# Patient Record
Sex: Female | Born: 1980 | Race: Black or African American | Hispanic: No
Health system: Southern US, Community
[De-identification: ages and names within clinical notes are randomized; demographics above are authoritative.]

---

## 2015-11-07 ENCOUNTER — Inpatient Hospital Stay
Admission: AD | Admit: 2015-11-07 | Discharge: 2015-12-04 | Disposition: A | Discharge status: Skilled Nursing Facility | Payer: Self-pay | Source: Ambulatory Visit | Attending: Internal Medicine | Admitting: Internal Medicine

## 2015-11-07 ENCOUNTER — Institutional Professional Consult (permissible substitution) (HOSPITAL_COMMUNITY): Payer: Self-pay

## 2015-11-07 DIAGNOSIS — I871 Compression of vein: Secondary | ICD-10-CM

## 2015-11-07 DIAGNOSIS — I509 Heart failure, unspecified: Secondary | ICD-10-CM

## 2015-11-07 DIAGNOSIS — A499 Bacterial infection, unspecified: Secondary | ICD-10-CM

## 2015-11-07 DIAGNOSIS — S069XAA Unspecified intracranial injury with loss of consciousness status unknown, initial encounter: Secondary | ICD-10-CM

## 2015-11-07 DIAGNOSIS — Z95828 Presence of other vascular implants and grafts: Secondary | ICD-10-CM

## 2015-11-07 DIAGNOSIS — J9690 Respiratory failure, unspecified, unspecified whether with hypoxia or hypercapnia: Secondary | ICD-10-CM

## 2015-11-07 DIAGNOSIS — J9620 Acute and chronic respiratory failure, unspecified whether with hypoxia or hypercapnia: Secondary | ICD-10-CM

## 2015-11-07 DIAGNOSIS — Z1612 Extended spectrum beta lactamase (ESBL) resistance: Secondary | ICD-10-CM

## 2015-11-07 DIAGNOSIS — S069X9A Unspecified intracranial injury with loss of consciousness of unspecified duration, initial encounter: Secondary | ICD-10-CM

## 2015-11-07 DIAGNOSIS — J690 Pneumonitis due to inhalation of food and vomit: Secondary | ICD-10-CM

## 2015-11-07 DIAGNOSIS — Z89619 Acquired absence of unspecified leg above knee: Secondary | ICD-10-CM

## 2015-11-07 DIAGNOSIS — Z931 Gastrostomy status: Secondary | ICD-10-CM

## 2015-11-07 DIAGNOSIS — R627 Adult failure to thrive: Secondary | ICD-10-CM

## 2015-11-07 MED ORDER — DIATRIZOATE MEGLUMINE & SODIUM 66-10 % PO SOLN
ORAL | Status: AC
  Administered 2015-11-07: 30 mL via GASTROSTOMY
  Filled 2015-11-07: qty 30

## 2015-11-08 DIAGNOSIS — R627 Adult failure to thrive: Secondary | ICD-10-CM

## 2015-11-08 DIAGNOSIS — J690 Pneumonitis due to inhalation of food and vomit: Secondary | ICD-10-CM

## 2015-11-08 DIAGNOSIS — Z89619 Acquired absence of unspecified leg above knee: Secondary | ICD-10-CM

## 2015-11-08 DIAGNOSIS — A499 Bacterial infection, unspecified: Secondary | ICD-10-CM

## 2015-11-08 DIAGNOSIS — S069XAA Unspecified intracranial injury with loss of consciousness status unknown, initial encounter: Secondary | ICD-10-CM

## 2015-11-08 DIAGNOSIS — S069X9A Unspecified intracranial injury with loss of consciousness of unspecified duration, initial encounter: Secondary | ICD-10-CM

## 2015-11-08 DIAGNOSIS — Z1612 Extended spectrum beta lactamase (ESBL) resistance: Secondary | ICD-10-CM

## 2015-11-08 DIAGNOSIS — J9620 Acute and chronic respiratory failure, unspecified whether with hypoxia or hypercapnia: Secondary | ICD-10-CM

## 2015-11-08 DIAGNOSIS — I509 Heart failure, unspecified: Secondary | ICD-10-CM

## 2015-11-08 LAB — COMPREHENSIVE METABOLIC PANEL
ALK PHOS: 143 U/L — AB (ref 38–126)
ALT: 39 U/L (ref 14–54)
AST: 39 U/L (ref 15–41)
Albumin: 2.9 g/dL — ABNORMAL LOW (ref 3.5–5.0)
Anion gap: 9 (ref 5–15)
BUN: 21 mg/dL — ABNORMAL HIGH (ref 6–20)
CALCIUM: 9.9 mg/dL (ref 8.9–10.3)
CO2: 29 mmol/L (ref 22–32)
CREATININE: 0.61 mg/dL (ref 0.44–1.00)
Chloride: 106 mmol/L (ref 101–111)
GFR calc non Af Amer: >60 mL/min (ref >60)
GLUCOSE: 176 mg/dL — AB (ref 65–99)
Potassium: 3.8 mmol/L (ref 3.5–5.1)
Sodium: 144 mmol/L (ref 135–145)
Total Bilirubin: 0.7 mg/dL (ref 0.3–1.2)
Total Protein: 8.5 g/dL — ABNORMAL HIGH (ref 6.5–8.1)

## 2015-11-08 LAB — PROTIME-INR
INR: 1.17 (ref 0.00–1.49)
Prothrombin Time: 15.1 seconds (ref 11.6–15.2)

## 2015-11-08 LAB — BLOOD GAS, ARTERIAL
Acid-Base Excess: 4.4 mmol/L — ABNORMAL HIGH (ref 0.0–2.0)
BICARBONATE: 28.3 meq/L — AB (ref 20.0–24.0)
FIO2: 0.4
O2 SAT: 97.1 %
PATIENT TEMPERATURE: 96
PO2 ART: 84.2 mmHg (ref 80.0–100.0)
TCO2: 29.6 mmol/L (ref 0–100)
pCO2 arterial: 38.7 mmHg (ref 35.0–45.0)
pH, Arterial: 7.469 — ABNORMAL HIGH (ref 7.350–7.450)

## 2015-11-08 NOTE — Consult Note (Signed)
Name: Freda MunroStephanie Braniff MRN: [id number] DOB: 1980-11-24    ADMISSION DATE:  11/07/2015 CONSULTATION DATE: 6/30  [facility]  CHIEF COMPLAINT:  Trach/vent dependent  SIGNIFICANT EVENTS   STUDIES:   HISTORY OF PRESENT ILLNESS:  35 yo female, NHP, hx of anoxic brain injury post arrest, trach and peg dependent. [facility] and [facility] but tolerating trach collar at times. It appears she was not totally vent dependent but records are scarce. She has [facility] Proteus [facility] and is on continued abx. She has multiple admissions and has chronic FTT. PCCM asked for assistance in weaning from ventilator.   Medical History Medical History Date Comments  Cardiac arrest [facility](HCC)    Brain injury (HCC)    Blood clots in brain    CHF (congestive heart failure) (HCC)    Hypotension    Pneumonia    Urinary tract infection    Tachycardia    Cardiogenic shock (HCC)    Hypercalcemia    Tachypnea    Cardiomyopathy, peripartum, postpartum condition    Acute renal failure (HCC)    Respiratory failure (HCC)    H/O extracorporeal membrane oxygenation treatment    Acquired absence of left leg below knee (HCC)    Bacteremia    Trichomoniasis    Hypercalcemia    Persistent vegetative state (HCC)    Vitamin D deficiency    Sepsis (HCC)    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    Propane Anaphylaxis (ALLERGY) High 05/17/2015  Patient had anaphylactic type reaction on 07/17/14 after TTE with optison, most likely cause of reaction.    Ampicillin-Sulbactam Anaphylaxis (ALLERGY) High 07/20/2014   Vancomycin Anaphylaxis (ALLERGY), Other (See Comments) High 12/14/2014 Other reaction(s): Other (See Comments)  Tachycardia (inc'd from 90's to 130's), tachypnea (30's), hives and swelling of neck, face, trunk, and extension to legs shortly after starting Vanc in ED on 12/14/14. Vanc stopped and pt given benadryl with minimal improvement after 20 minutes. Pt treated for anaphylaxis.   Tachycardia (inc'd from 90's to 130's), tachypnea (30's), hives and swelling of neck, face, trunk, and extension to legs shortly after starting Vanc in ED on 12/14/14. Vanc stopped and pt given benadryl with minimal improvement after 20 minutes. Pt treated for anaphylaxis.    Penicillins Rash (ALLERGY/intolerance), Urticaria / Hives (ALLERGY) Medium 06/22/2011 Has tolerated cefepime and ceftriaxone  Tolerated meropenem 07/2014  Has tolerated cefepime and ceftriaxone  Tolerated meropenem 07/2014     FAMILY HISTORY:  family history is not on file. SOCIAL HISTORY: NHR REVIEW OF SYSTEMS:   Na   SUBJECTIVE:  Vegetative state  VITAL SIGNS:    PHYSICAL EXAMINATION: General:  Ill appearing female on vent Neuro:  No response to verbal stimulus. No tracks or follows HEENT:Trach to vent Cardiovascular:  HSR RRR Lungs:  Coarse rhonchi bilateral Abdomen:  Peg and tube feeds in place Musculoskeletal:  Left AKS Skin:  Warm and moist  No results for input(s): NA, K, CL, CO2, BUN,  CREATININE, GLUCOSE in the last 168 hours. No results for input(s): HGB, HCT, WBC, PLT in the last 168 hours. Dg Chest Port 1 View  11/07/2015  CLINICAL DATA:  Respiratory failure. EXAM: PORTABLE CHEST 1 VIEW COMPARISON:  None. FINDINGS: This is a low volume film with bibasilar atelectasis. A tracheostomy tube is noted. A right central venous catheter is present with tip  overlying the right atrium. There is no evidence of pneumothorax or definite pleural effusion. IMPRESSION: Low volume film with bibasilar atelectasis. Right central venous catheter with tip overlying the right atrium. No evidence of pneumothorax. Tracheostomy tube. Electronically Signed   By: Harmon PierJeffrey  Hu M.D.   On: 11/07/2015 21:18   Dg Abd Portable 1v  11/07/2015  CLINICAL DATA:  Gastrostomy catheter placement EXAM: PORTABLE ABDOMEN - 1 VIEW COMPARISON:  None. FINDINGS: 30 mL of Gastrografin was administered through previously placed gastrostomy catheter. Gastrostomy catheter is in the stomach. No contrast extravasation. Bowel gas pattern is normal. Rectal thermometer present. Degenerative changes noted in both hip joints. IMPRESSION: Contrast extends through the gastrostomy catheter into the stomach without extravasation. Bowel gas pattern unremarkable. Electronically Signed   By: Bretta BangWilliam  Woodruff III M.D.   On: 11/07/2015 21:18    ASSESSMENT      Acute on chronic respiratory failure (HCC) trach depedent   Traumatic brain injury (HCC) persistent vegatative state   ESBL (extended spectrum beta-lactamase) producing bacteria infection   S/P AKA (above knee amputation) (HCC) left   Aspiration pneumonia (HCC) proteus M. ESBL   CHF (congestive heart failure) (HCC)   FTT (failure to thrive) in adult  Discussion:  35 yo female, NHP, hx of anoxic brain injury post arrest,[facility] but tolerating trach collar at times. It appears she was not totally vent dependent but records are scarce. She has been treated for ESBL proteus M aspir[facility] pna [facility] and is on continued abx. She has multiple admissions and has chronic FTT. PCCM asked for assistance in weaning from ventilator.    PLAN: Wean vent as tolerated. Currently on t collar but with copious secretions therefore nocturnal vent till abx completed and  secretions lessen.  [facility]  [facility]  [facility] ACNP [facility] PCCM Pager [phone] till 3 pm If no answer page [phone] 11/08/2015, 11:44 AM   Attending Note:  I have examined patient, reviewed labs, studies and notes. I have discussed the case with S Minor, and I agree with the data and plans as amended above. 34 debilitated woman due to hx of arrest and anoxic brain injury, trach and peg dependent. [facility] but tolerating trach collar at times. She has been treated for ESBL Proteus aspiration pna [facility] and is on continued abx. On my evaluation she is on trach collar. She will turn her head to voice but will not interact or follow commands. Janina Mayorach looks CDI. Lung sounds are coarse bilaterally. She has reportedly had abundant secretions although none currently. We will plan to push for ATC during the day, vent at night with duration of nocturnal vent to be weaned, especially as secretions improve. She does NOT look like a decannulation candidate. We will follow.   Independent critical care time is 35 minutes.   Levy Pupaobert Sabella Traore, MD, PhD 11/08/2015, 12:49 PM [place] [facility] [phone] or if no answer [phone]

## 2015-11-11 ENCOUNTER — Other Ambulatory Visit (HOSPITAL_COMMUNITY): Payer: Self-pay

## 2015-11-11 LAB — CBC
HCT: 38.7 % (ref 36.0–46.0)
Hemoglobin: 11.4 g/dL — ABNORMAL LOW (ref 12.0–15.0)
MCH: 26.1 pg (ref 26.0–34.0)
MCHC: 29.5 g/dL — AB (ref 30.0–36.0)
MCV: 88.6 fL (ref 78.0–100.0)
PLATELETS: 268 10*3/uL (ref 150–400)
RBC: 4.37 MIL/uL (ref 3.87–5.11)
RDW: 16.5 % — ABNORMAL HIGH (ref 11.5–15.5)
WBC: 6.4 10*3/uL (ref 4.0–10.5)

## 2015-11-11 LAB — BASIC METABOLIC PANEL
Anion gap: 4 — ABNORMAL LOW (ref 5–15)
BUN: 14 mg/dL (ref 6–20)
CALCIUM: 9.1 mg/dL (ref 8.9–10.3)
CO2: 30 mmol/L (ref 22–32)
CREATININE: 0.39 mg/dL — AB (ref 0.44–1.00)
Chloride: 107 mmol/L (ref 101–111)
Glucose, Bld: 155 mg/dL — ABNORMAL HIGH (ref 65–99)
Potassium: 4.1 mmol/L (ref 3.5–5.1)
SODIUM: 141 mmol/L (ref 135–145)

## 2015-11-13 DIAGNOSIS — R0902 Hypoxemia: Secondary | ICD-10-CM

## 2015-11-13 DIAGNOSIS — Z930 Tracheostomy status: Secondary | ICD-10-CM

## 2015-11-13 NOTE — Progress Notes (Signed)
   Name: Tina MunroStephanie Dennis MRN: [id number] DOB: 23-Jul-1980    ADMISSION DATE:  11/07/2015 CONSULTATION DATE: 6/30  [facility]  [facility]  CHIEF COMPLAINT:  Trach/vent dependent  SIGNIFICANT EVENTS   STUDIES:   HISTORY OF PRESENT ILLNESS:  35 yo female, NHP, hx of anoxic brain injury post arrest, trach and peg dependent. [facility] but tolerating trach collar at times. It appears she was not totally vent dependent but records are scarce. She has been treated for ESBL Proteus [facility] and is on continued abx. She has multiple admissions and has chronic FTT. PCCM asked for assistance in weaning from ventilator.   SUBJECTIVE:  Vegetative state, no events overnight.  VITAL SIGNS:  I reviewed VS bedside, sat of 95% on 28% TC.  PHYSICAL EXAMINATION: General:  Ill appearing female on TC, completely unresponsive. Neuro:  No response to verbal stimulus. No tracks or follows HEENT:Trach to vent Cardiovascular:  HSR RRR Lungs:  Coarse rhonchi bilateral Abdomen:  Peg and tube feeds in place Musculoskeletal:  Left AKS Skin:  Warm and moist  Recent Labs Lab 11/08/15 1025 11/11/15 0644  NA 144 141  K 3.8 4.1  CL 106 107  CO2 29 30  BUN 21* 14  CREATININE 0.61 0.39*  GLUCOSE 176* 155*   Recent Labs Lab 11/11/15 0644  HGB 11.4*  HCT 38.7  WBC 6.4  PLT 268   I reviewed CXR myself, trach in good position.  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 ESBL   CHF (congestive heart failure) (HCC)   FTT (failure to thrive) in adult  Discussion:  35 yo female, NHP, hx of anoxic brain injury post arrest,[facility] for  aspiration pna/sepsis and [facility] requiring MVS but tolerating trach collar at times. It appears she was not totally vent dependent but records are scarce. She has been treated for ESBL proteus M aspirating pna [facility] and is on continued abx. She has multiple admissions and has chronic FTT. PCCM asked for assistance in weaning from ventilator.   PLAN: Titrate O2 for sat of 88-92%. No PMV or capping. Not a decannulation candidate Bronchodilators as ordered. Would consider a palliative approach here if family is willing, I see no reasonable chance at any meaningful recover.  Discussed with SSH-RT.  Alyson ReedyWesam G. Karlisa Gaubert, M.D. [facility]. Pager: [phone]. After hours pager: [phone].

## 2015-11-18 LAB — COMPREHENSIVE METABOLIC PANEL
ALK PHOS: 127 U/L — AB (ref 38–126)
ALT: 24 U/L (ref 14–54)
ANION GAP: 7 (ref 5–15)
AST: 18 U/L (ref 15–41)
Albumin: 2.8 g/dL — ABNORMAL LOW (ref 3.5–5.0)
BILIRUBIN TOTAL: 0.8 mg/dL (ref 0.3–1.2)
BUN: 8 mg/dL (ref 6–20)
CALCIUM: 9.2 mg/dL (ref 8.9–10.3)
CO2: 25 mmol/L (ref 22–32)
Chloride: 103 mmol/L (ref 101–111)
Creatinine, Ser: 0.39 mg/dL — ABNORMAL LOW (ref 0.44–1.00)
GFR calc non Af Amer: >60 mL/min (ref >60)
GLUCOSE: 195 mg/dL — AB (ref 65–99)
Potassium: 4.2 mmol/L (ref 3.5–5.1)
Sodium: 135 mmol/L (ref 135–145)
TOTAL PROTEIN: 6.9 g/dL (ref 6.5–8.1)

## 2015-11-18 LAB — CBC WITH DIFFERENTIAL/PLATELET
Basophils Absolute: 0 10*3/uL (ref 0.0–0.1)
Basophils Relative: 0 %
Eosinophils Absolute: 0.4 10*3/uL (ref 0.0–0.7)
Eosinophils Relative: 4 %
HEMATOCRIT: 40.4 % (ref 36.0–46.0)
HEMOGLOBIN: 12.7 g/dL (ref 12.0–15.0)
LYMPHS ABS: 2 10*3/uL (ref 0.7–4.0)
Lymphocytes Relative: 19 %
MCH: 26.1 pg (ref 26.0–34.0)
MCHC: 31.4 g/dL (ref 30.0–36.0)
MCV: 83 fL (ref 78.0–100.0)
MONOS PCT: 8 %
Monocytes Absolute: 0.9 10*3/uL (ref 0.1–1.0)
NEUTROS ABS: 7.6 10*3/uL (ref 1.7–7.7)
NEUTROS PCT: 69 %
Platelets: 230 10*3/uL (ref 150–400)
RBC: 4.87 MIL/uL (ref 3.87–5.11)
RDW: 17.1 % — ABNORMAL HIGH (ref 11.5–15.5)
WBC: 11 10*3/uL — ABNORMAL HIGH (ref 4.0–10.5)

## 2015-11-18 NOTE — Progress Notes (Signed)
   Name: Tina MunroStephanie Snowdon MRN: [id number] DOB: 1980-08-11    ADMISSION DATE:  11/07/2015 CONSULTATION DATE: 6/30  [facility]  CHIEF COMPLAINT:  Trach/vent dependent  SIGNIFICANT EVENTS   STUDIES:   HISTORY OF PRESENT ILLNESS:  35 yo female, NHP, hx of anoxic brain injury post arrest, trach and peg dependent. [facility] but tolerating trach collar at times. It appears she was not totally vent dependent but records are scarce. She has been [facility] ESBL Proteus [facility] and is on continued abx. She has multiple admissions and has chronic FTT. PCCM asked for assistance in weaning from ventilator.   SUBJECTIVE:  Unresponsive. No changes reported by RN o/n  VITAL SIGNS:   Reviewed at bedside  PHYSICAL EXAMINATION: General:  Ill appearing female on MV, completely unresponsive. Neuro:  No response to verbal stimulus. 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 ESBL   CHF (congestive heart failure) (HCC)   FTT (failure to thrive) in adult  Discussion:  35 yo female, NHP, hx of anoxic brain injury post arrest,[facility] p[facility]/sepsis and [facility] requiring [facility]S but tolerating trach collar at times. She has  been treated for ESBL proteus M aspirating pna [facility] and is on continued abx. She has multiple admissions and has chronic FTT. PCCM asked for assistance in weaning from ventilator.   PLAN: Titrate O2 for sat of 88-92%. Continue to push ATC as she can tolerate No PMV or capping. Not a decannulation candidate Bronchodilators as ordered. Would consider a palliative approach here if family is willing, I see no reasonable chance at any meaningful recover.  Discussed with SSH-RT.  Levy Pupaobert Cimone Fahey, MD, PhD 11/18/2015, 12:54 PM [place] [facility] [phone] or if no answer [phone]

## 2015-11-19 LAB — TSH: TSH: 0.863 u[IU]/mL (ref 0.350–4.500)

## 2015-11-20 ENCOUNTER — Other Ambulatory Visit (HOSPITAL_COMMUNITY): Payer: Self-pay

## 2015-11-20 MED ORDER — IOPAMIDOL (ISOVUE-300) INJECTION 61%
100.0000 mL | Freq: Once | INTRAVENOUS | Status: AC | PRN
  Administered 2015-11-20: 100 mL via INTRAVENOUS

## 2015-11-22 LAB — CBC
HEMATOCRIT: 34.3 % — AB (ref 36.0–46.0)
Hemoglobin: 10.8 g/dL — ABNORMAL LOW (ref 12.0–15.0)
MCH: 26 pg (ref 26.0–34.0)
MCHC: 31.5 g/dL (ref 30.0–36.0)
MCV: 82.7 fL (ref 78.0–100.0)
Platelets: 243 10*3/uL (ref 150–400)
RBC: 4.15 MIL/uL (ref 3.87–5.11)
RDW: 16.8 % — ABNORMAL HIGH (ref 11.5–15.5)
WBC: 8.7 10*3/uL (ref 4.0–10.5)

## 2015-11-22 LAB — PROCALCITONIN: Procalcitonin: <0.1 ng/mL

## 2015-11-23 LAB — BASIC METABOLIC PANEL
Anion gap: 8 (ref 5–15)
BUN: 8 mg/dL (ref 6–20)
CO2: 27 mmol/L (ref 22–32)
CREATININE: 0.37 mg/dL — AB (ref 0.44–1.00)
Calcium: 9.4 mg/dL (ref 8.9–10.3)
Chloride: 100 mmol/L — ABNORMAL LOW (ref 101–111)
GFR calc Af Amer: >60 mL/min (ref >60)
GLUCOSE: 146 mg/dL — AB (ref 65–99)
POTASSIUM: 5.9 mmol/L — AB (ref 3.5–5.1)
SODIUM: 135 mmol/L (ref 135–145)

## 2015-11-23 LAB — CULTURE, RESPIRATORY: CULTURE: NORMAL

## 2015-11-23 LAB — CULTURE, RESPIRATORY W GRAM STAIN

## 2015-11-24 LAB — BASIC METABOLIC PANEL
Anion gap: 9 (ref 5–15)
BUN: 5 mg/dL — AB (ref 6–20)
CALCIUM: 9.4 mg/dL (ref 8.9–10.3)
CO2: 26 mmol/L (ref 22–32)
Chloride: 100 mmol/L — ABNORMAL LOW (ref 101–111)
Creatinine, Ser: 0.38 mg/dL — ABNORMAL LOW (ref 0.44–1.00)
GFR calc Af Amer: >60 mL/min (ref >60)
GLUCOSE: 162 mg/dL — AB (ref 65–99)
Potassium: 4.2 mmol/L (ref 3.5–5.1)
Sodium: 135 mmol/L (ref 135–145)

## 2015-11-24 LAB — PROTIME-INR
INR: 1.15 (ref 0.00–1.49)
PROTHROMBIN TIME: 14.9 s (ref 11.6–15.2)

## 2015-11-25 DIAGNOSIS — Z430 Encounter for attention to tracheostomy: Secondary | ICD-10-CM

## 2015-11-25 DIAGNOSIS — G931 Anoxic brain damage, not elsewhere classified: Secondary | ICD-10-CM

## 2015-11-25 DIAGNOSIS — J9601 Acute respiratory failure with hypoxia: Secondary | ICD-10-CM

## 2015-11-25 LAB — PROTIME-INR
INR: 1.06 (ref 0.00–1.49)
Prothrombin Time: 14 seconds (ref 11.6–15.2)

## 2015-11-25 LAB — CULTURE, BLOOD (ROUTINE X 2)
Culture: NO GROWTH
Culture: NO GROWTH

## 2015-11-25 NOTE — Progress Notes (Signed)
Name: Tina Dennis MRN: [id number] DOB: 04-Jan-1981    ADMISSION DATE:  11/07/2015 CONSULTATION DATE: 6/30  REFERRING MD :  [facility]  CHIEF COMPLAINT:  Trach/vent dependent  HISTORY OF PRESENT ILLNESS:  35 yo female, NHP, hx of anoxic brain injury post arrest, trach and peg dependent. Was at [facility] 6/16-29 for aspiration pna/sepsis and transferred to [facility] 6/29 requiring MVS but tolerating trach collar at times. It appears she was not totally vent dependent but records are scarce. She has been treated for ESBL Proteus aspiration pna at [facility] and is on continued abx. She has multiple admissions and has chronic FTT. PCCM asked for assistance in weaning from ventilator.   SUBJECTIVE:  Unresponsive. No change per RN, RT.  Tolerating ATC daytime, qhs vent.   VITAL SIGNS:   Reviewed at bedside  PHYSICAL EXAMINATION: General:  Ill appearing female on MV, completely unresponsive. Neuro:  No response to verbal stimulus. 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 ESBL   CHF (congestive heart failure) (HCC)   FTT (failure to thrive) in adult  Discussion:  35 yo female, NHP, hx of anoxic brain injury post arrest,trach and peg dependent at [facility] 6/16-29 for aspiration pna/sepsis and transferred to [facility]  6/29 requiring MVS but tolerating trach collar at times. She has been treated for ESBL proteus M aspirating pna at [facility] and is on continued abx. She has multiple admissions and has chronic FTT. PCCM asked for assistance in weaning from ventilator.   PLAN: Titrate O2 for sat of 88-92%. Continue ATC daytime, qhs vent  No PMV or capping. Not a decannulation candidate Bronchodilators as ordered. Would consider a palliative approach here if family is willing, I see no reasonable chance at any meaningful recover.   Dirk Dress, NP 11/25/2015  11:32 AM Pager: [phone] or [phone]  PCCM Attending Note: Patient seen and examined with nurse practitioner. Please refer to progress note which I reviewed in detail. Patient continues to have altered mental status and unable to provide review of systems. Currently on tracheostomy collar.  Review of systems: Unobtainable given altered mental status.  Vital signs: BP 120/57  saturation 100% on tracheostomy collar   respiratory rate 15   heart rate 80 Gen.: Patient lying in bed. No distress. Eyes open. Neurological: Does not follow commands. Smiles intermittently. Does not attend to voice.   Integument: Warm and dry. No rash on exposed skin. Pulmonary: Clear bilaterally to auscultation. Normal work of breathing on tracheostomy collar.  CBC Latest Ref Rng 11/22/2015 11/18/2015 11/11/2015  WBC 4.0 - 10.5 K/uL 8.7 11.0(H) 6.4  Hemoglobin 12.0 - 15.0 g/dL 10.8(L) 12.7 11.4(L)  Hematocrit 36.0 - 46.0 % 34.3(L) 40.4 38.7  Platelets 150 - 400 K/uL 243 230 268    BMP Latest Ref Rng 11/24/2015 11/23/2015 11/18/2015  Glucose 65 - 99 mg/dL 409(W162(H) 119(J146(H) 478(G195(H)  BUN 6 - 20 mg/dL 5(L) 8 8  Creatinine 9.560.44 - 1.00 mg/dL 2.13(Y0.38(L) 8.65(H0.37(L) 8.46(N0.39(L)  Sodium 135 - 145 mmol/L 135 135 135  Potassium 3.5 - 5.1 mmol/L 4.2 5.9(H) 4.2  Chloride 101 - 111 mmol/L 100(L) 100(L) 103  CO2 22 - 32 mmol/L 26 27 25   Calcium 8.9 - 10.3 mg/dL 9.4 9.4 9.2    A/P:   35 year old female with postarrest anoxic brain injury. Status post tracheostomy placement. Patient with continued altered mental status. Tolerating daytime tracheostomy collar.  1. Acute hypoxic respiratory failure: Recommend continuing tracheostomy collar during the day & resting on full support overnight with ventilator. Not a candidate for [facility] valve or capping. Not a decannulation candidate. 2. Anoxic brain injury: Unlikely patient will have any meaningful recovery. Would recommend considering a palliative approach if family is willing.  Donna ChristenJennings E. Jamison NeighborNestor, M.D. [facility] Pager:  [phone] After 3pm or if no response, call [phone] 5:21 PM 11/25/2015

## 2015-11-26 LAB — PROTIME-INR
INR: 1.16 (ref 0.00–1.49)
Prothrombin Time: 15 seconds (ref 11.6–15.2)

## 2015-11-27 ENCOUNTER — Other Ambulatory Visit (HOSPITAL_COMMUNITY): Payer: Self-pay

## 2015-11-27 LAB — BASIC METABOLIC PANEL
Anion gap: 8 (ref 5–15)
CO2: 29 mmol/L (ref 22–32)
CREATININE: 0.38 mg/dL — AB (ref 0.44–1.00)
Calcium: 9.5 mg/dL (ref 8.9–10.3)
Chloride: 101 mmol/L (ref 101–111)
GFR calc Af Amer: >60 mL/min (ref >60)
GLUCOSE: 115 mg/dL — AB (ref 65–99)
POTASSIUM: 4.2 mmol/L (ref 3.5–5.1)
SODIUM: 138 mmol/L (ref 135–145)

## 2015-11-27 LAB — CBC
HCT: 35 % — ABNORMAL LOW (ref 36.0–46.0)
HEMOGLOBIN: 10.8 g/dL — AB (ref 12.0–15.0)
MCH: 25.8 pg — AB (ref 26.0–34.0)
MCHC: 30.9 g/dL (ref 30.0–36.0)
MCV: 83.7 fL (ref 78.0–100.0)
PLATELETS: 252 10*3/uL (ref 150–400)
RBC: 4.18 MIL/uL (ref 3.87–5.11)
RDW: 17.2 % — AB (ref 11.5–15.5)
WBC: 6.3 10*3/uL (ref 4.0–10.5)

## 2015-11-27 LAB — BLOOD GAS, ARTERIAL
ACID-BASE EXCESS: 2.6 mmol/L — AB (ref 0.0–2.0)
Bicarbonate: 27.1 mEq/L — ABNORMAL HIGH (ref 20.0–24.0)
FIO2: 0.4
MECHVT: 320 mL
O2 SAT: 98.9 %
PEEP/CPAP: 5 cmH2O
PH ART: 7.392 (ref 7.350–7.450)
PO2 ART: 135 mmHg — AB (ref 80.0–100.0)
Patient temperature: 98.6
RATE: 10 resp/min
TCO2: 28.5 mmol/L (ref 0–100)
pCO2 arterial: 45.6 mmHg — ABNORMAL HIGH (ref 35.0–45.0)

## 2015-11-27 LAB — PROTIME-INR
INR: 1.18 (ref 0.00–1.49)
PROTHROMBIN TIME: 15.1 s (ref 11.6–15.2)

## 2015-11-28 LAB — BASIC METABOLIC PANEL
ANION GAP: 6 (ref 5–15)
BUN: 7 mg/dL (ref 6–20)
CALCIUM: 9.2 mg/dL (ref 8.9–10.3)
CHLORIDE: 101 mmol/L (ref 101–111)
CO2: 29 mmol/L (ref 22–32)
Creatinine, Ser: 0.42 mg/dL — ABNORMAL LOW (ref 0.44–1.00)
GFR calc Af Amer: >60 mL/min (ref >60)
GFR calc non Af Amer: >60 mL/min (ref >60)
GLUCOSE: 134 mg/dL — AB (ref 65–99)
POTASSIUM: 4.1 mmol/L (ref 3.5–5.1)
Sodium: 136 mmol/L (ref 135–145)

## 2015-11-28 LAB — CBC WITH DIFFERENTIAL/PLATELET
BASOS ABS: 0 10*3/uL (ref 0.0–0.1)
Basophils Relative: 0 %
Eosinophils Absolute: 0.2 10*3/uL (ref 0.0–0.7)
Eosinophils Relative: 3 %
HEMATOCRIT: 35.6 % — AB (ref 36.0–46.0)
HEMOGLOBIN: 11 g/dL — AB (ref 12.0–15.0)
LYMPHS PCT: 21 %
Lymphs Abs: 1.5 10*3/uL (ref 0.7–4.0)
MCH: 26.6 pg (ref 26.0–34.0)
MCHC: 30.9 g/dL (ref 30.0–36.0)
MCV: 86 fL (ref 78.0–100.0)
MONO ABS: 0.3 10*3/uL (ref 0.1–1.0)
MONOS PCT: 4 %
NEUTROS ABS: 5.2 10*3/uL (ref 1.7–7.7)
NEUTROS PCT: 72 %
Platelets: 256 10*3/uL (ref 150–400)
RBC: 4.14 MIL/uL (ref 3.87–5.11)
RDW: 17.1 % — AB (ref 11.5–15.5)
WBC: 7.3 10*3/uL (ref 4.0–10.5)

## 2015-11-28 LAB — PROTIME-INR
INR: 1.07 (ref 0.00–1.49)
Prothrombin Time: 14.1 seconds (ref 11.6–15.2)

## 2015-11-29 ENCOUNTER — Other Ambulatory Visit (HOSPITAL_COMMUNITY): Payer: Self-pay

## 2015-11-29 LAB — PROTIME-INR
INR: 1.23 (ref 0.00–1.49)
PROTHROMBIN TIME: 15.6 s — AB (ref 11.6–15.2)

## 2015-11-30 LAB — PROTIME-INR
INR: 1.2 (ref 0.00–1.49)
PROTHROMBIN TIME: 15.4 s — AB (ref 11.6–15.2)

## 2015-12-01 LAB — PROTIME-INR
INR: 1.18 (ref 0.00–1.49)
PROTHROMBIN TIME: 15.2 s (ref 11.6–15.2)

## 2015-12-02 LAB — PROTIME-INR
INR: 1.04 (ref 0.00–1.49)
PROTHROMBIN TIME: 13.8 s (ref 11.6–15.2)

## 2015-12-02 NOTE — Progress Notes (Addendum)
Name: Tina Dennis MRN: [id number] DOB: 14-May-1980    ADMISSION DATE:  11/07/2015 CONSULTATION DATE: 6/30  REFERRING MD :  [facility]  CHIEF COMPLAINT:  Trach/vent dependent  HISTORY OF PRESENT ILLNESS:  35 yo female, NHP, hx of anoxic brain injury post arrest, trach and peg dependent. Was at [facility] 6/16-29 for aspiration pna/sepsis and transferred to [facility] 6/29 requiring MVS but tolerating trach collar at times. It appears she was not totally vent dependent but records are scarce. She has been treated for ESBL Proteus aspiration pna at [facility] and is on continued abx. She has multiple admissions and has chronic FTT. PCCM asked for assistance in weaning from ventilator.   SUBJECTIVE:  Unresponsive. No change per RN, RT.  Tolerating ATC daytime, qhs vent.   VITAL SIGNS: Hr 102, rr 21 sats 100& (.28)  PHYSICAL EXAMINATION: General:  Ill appearing female on MV, completely unresponsive. Neuro:  No response to verbal stimulus. 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 ESBL   CHF (congestive heart failure) (HCC)   FTT (failure to thrive) in adult  Discussion:  35 yo female, NHP, hx of anoxic brain injury post arrest,trach and peg dependent at [facility] 6/16-29 for  aspiration pna/sepsis and transferred to [facility] 6/29 requiring mechanical ventilation but tolerating trach collar at times. She has been treated for ESBL proteus M aspirating pna at [facility] and is on continued abx. She has multiple admissions and has chronic FTT. PCCM asked for assistance in weaning from ventilator.  Interval from 7/17 to 7/25:  Tolerating ATC For SNF tomorrow   PLAN: Titrate O2 for sat of 88-92%. Continue ATC daytime, qhs vent  No PMV or capping. Not a decannulation candidate Bronchodilators as ordered. We will s/o  Simonne Martinet ACNP-BC [facility] Pulmonary/Critical Care Pager # [phone] OR # [phone] if no answer  Attending Note:  35 year old female with brain injury post cardiac arrest.  Vegetative on exam.  CXR I reviewed myself, trach in good position.  Lungs clear to auscultation.  Discussed with PCCM-NP and SSH-RT.  Respiratory failure  - Maintain current trach size and type.  - Attempt TC as able.  Hypoxemia  - Titrate O2 for sat of 88-92%.  Pulmonary edema  - Keep dry.  - Lasix as needed.  GOC: recommend DNR status but family insists on full code even with such tremendous brain injury.  Transferring back to vent SNF, continue weaning efforts, PCCM will sign off, please call back if needed.  Patient seen and examined, agree with above note.  I dictated the care and orders written for this patient under my direction.  Alyson Reedy, MD [phone]

## 2015-12-03 DIAGNOSIS — J810 Acute pulmonary edema: Secondary | ICD-10-CM

## 2015-12-03 DIAGNOSIS — S069X0D Unspecified intracranial injury without loss of consciousness, subsequent encounter: Secondary | ICD-10-CM

## 2015-12-03 LAB — PROTIME-INR
INR: 1.09 (ref 0.00–1.49)
PROTHROMBIN TIME: 14.3 s (ref 11.6–15.2)

## 2015-12-03 LAB — BASIC METABOLIC PANEL
ANION GAP: 4 — AB (ref 5–15)
BUN: 6 mg/dL (ref 6–20)
CALCIUM: 9.2 mg/dL (ref 8.9–10.3)
CO2: 29 mmol/L (ref 22–32)
Chloride: 103 mmol/L (ref 101–111)
Creatinine, Ser: 0.33 mg/dL — ABNORMAL LOW (ref 0.44–1.00)
Glucose, Bld: 122 mg/dL — ABNORMAL HIGH (ref 65–99)
POTASSIUM: 4.2 mmol/L (ref 3.5–5.1)
Sodium: 136 mmol/L (ref 135–145)

## 2015-12-04 LAB — PROTIME-INR
INR: 1.22
Prothrombin Time: 15.6 seconds — ABNORMAL HIGH (ref 11.4–15.2)

## 2016-12-08 IMAGING — CR DG CHEST 1V PORT
1 series · 1 of 1 positions shown · non-contrast
Comparison: None.

CLINICAL DATA: Respiratory failure.

EXAM:
PORTABLE CHEST 1 VIEW

[AP]
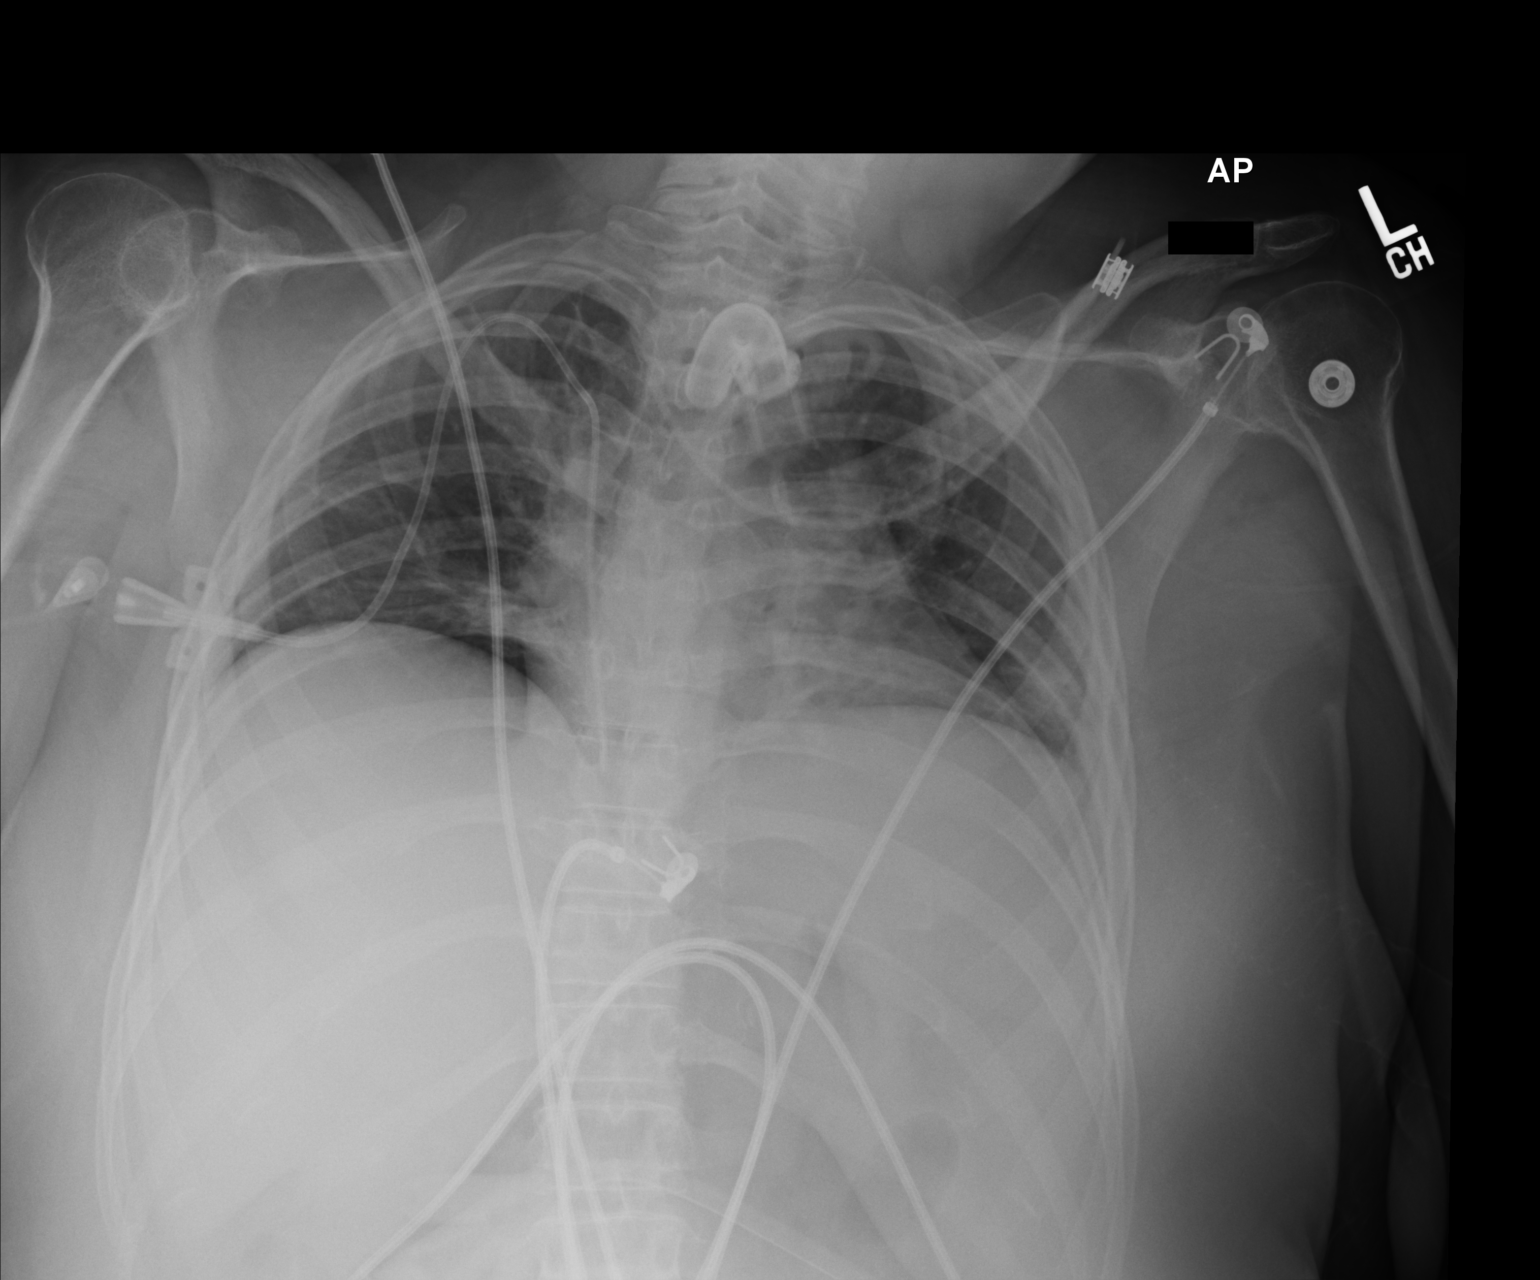

[1 of 1 positions shown; findings below may reference images not displayed]

FINDINGS: This is a low volume film with bibasilar atelectasis.

A tracheostomy tube is noted.

A right central venous catheter is present with tip overlying the
right atrium.

There is no evidence of pneumothorax or definite pleural effusion.
IMPRESSION: Low volume film with bibasilar atelectasis.

Right central venous catheter with tip overlying the right atrium.
No evidence of pneumothorax.

Tracheostomy tube.

## 2016-12-21 IMAGING — CT CT NECK W/ CM
4 of 5 series · 16 of 33 positions shown, 18 images · IV contrast (iopamidol)
Comparison: None.

CLINICAL DATA: Anoxic brain injury, status post arrest. History of
tracheostomy tube, aspiration pneumonia. Assess for superior vena
cava occlusion.

EXAM:
CT NECK WITH CONTRAST
TECHNIQUE: Multidetector CT imaging of the neck was performed using the
standard protocol following the bolus administration of intravenous
contrast.
CONTRAST:  100mL OZVQ50-5LL IOPAMIDOL (OZVQ50-5LL) INJECTION 61%

[Series 401: soft tissue, idose (2) · axial · 0.54mm/px · z∈[+654,+776]mm · 4 of 103 slices shown]
[im 21/103  soft-tissue]
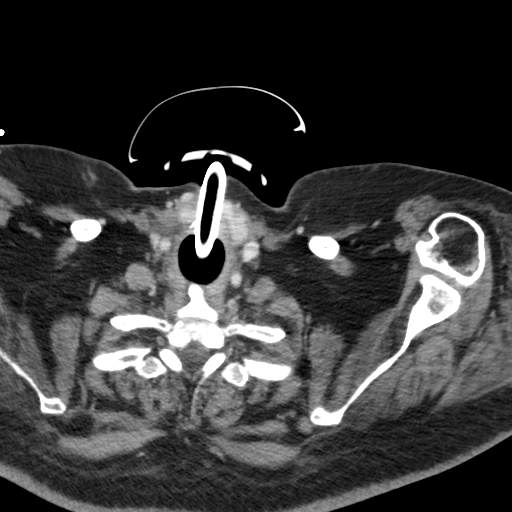
[im 41/103  soft-tissue]
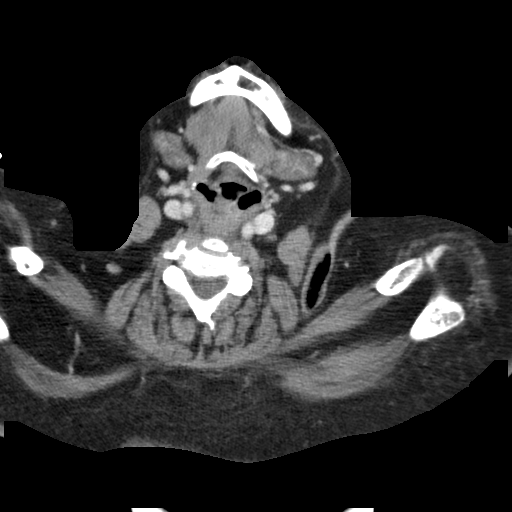
[im 62/103  soft-tissue]
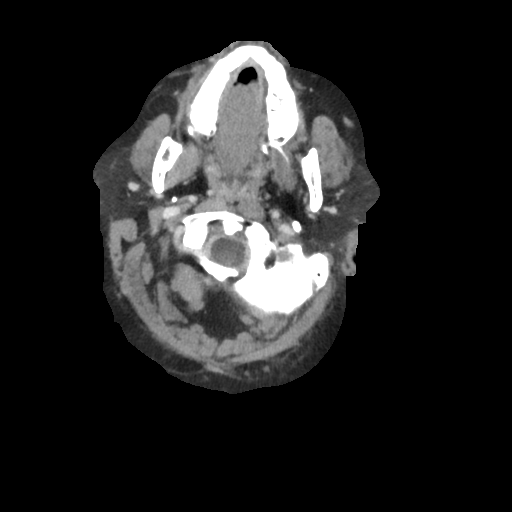
[im 82/103  soft-tissue]
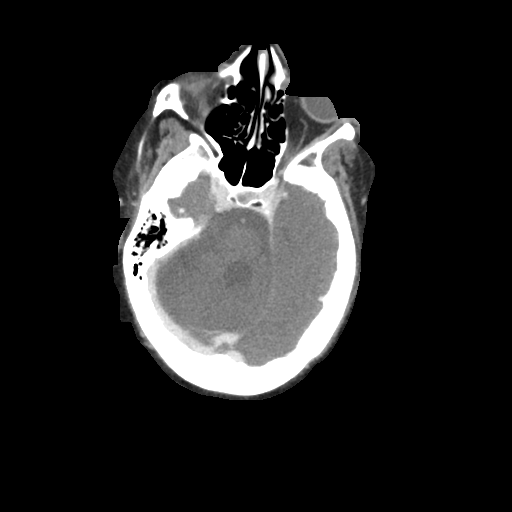

[Series 403: coronal, idose (2) · coronal · 0.45mm/px · 3 of 112 slices shown]
[im 23/112  bone]
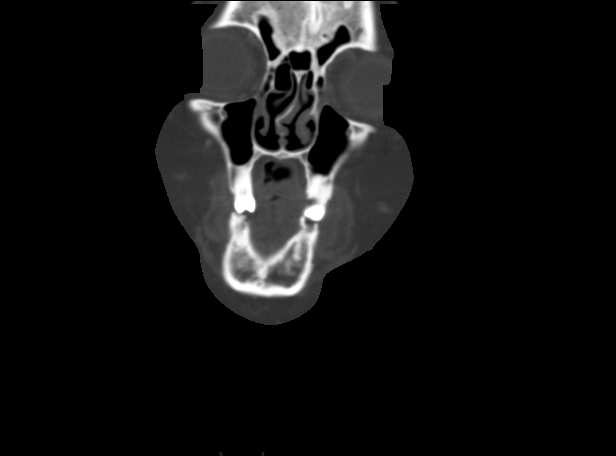
[im 45/112  bone]
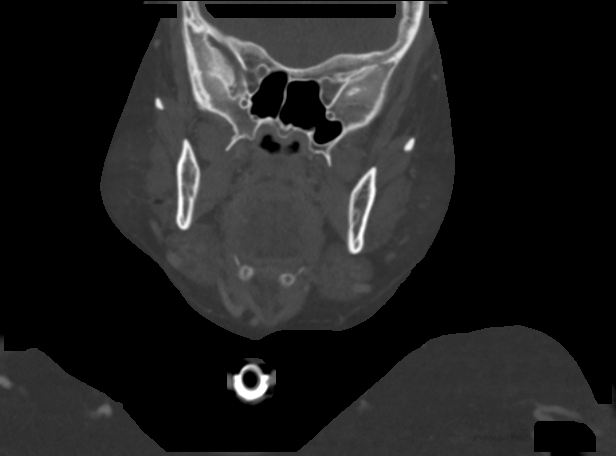
[im 67/112  bone]
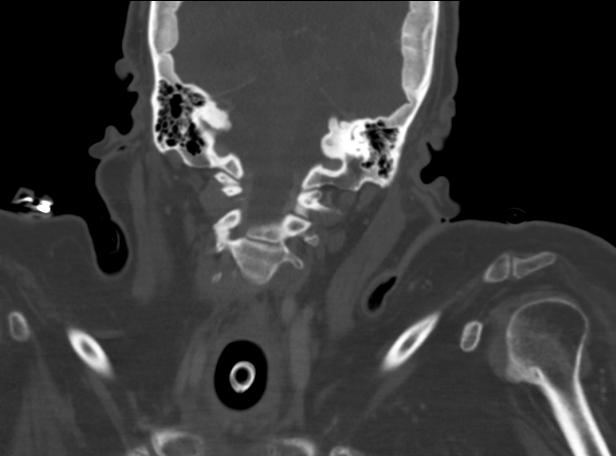

[Series 404: sagittal, idose (2) · sagittal · 0.45mm/px · 5 of 95 slices shown, 6 images]
[im 32/95  bone]
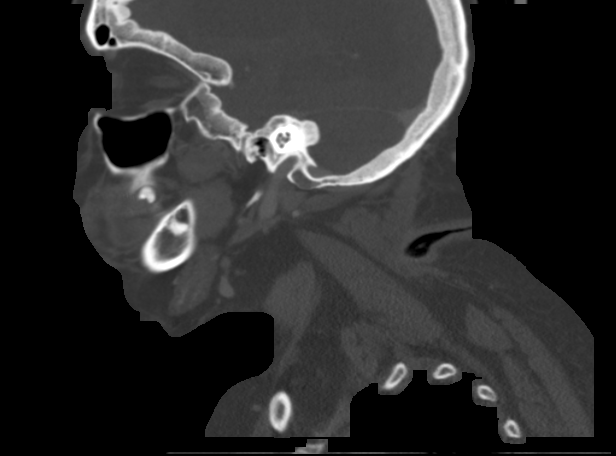
[im 40/95  bone]
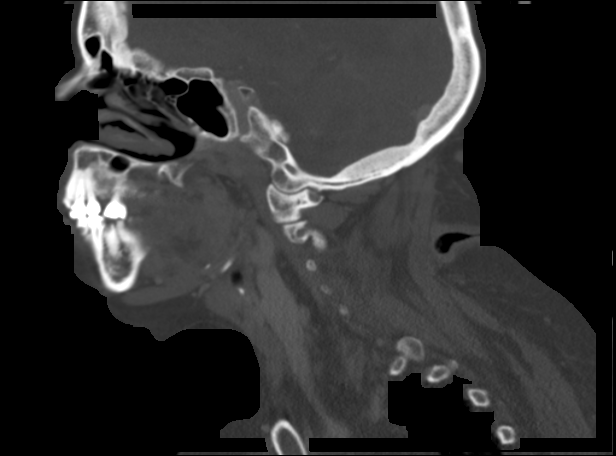
[im 48/95  soft-tissue]
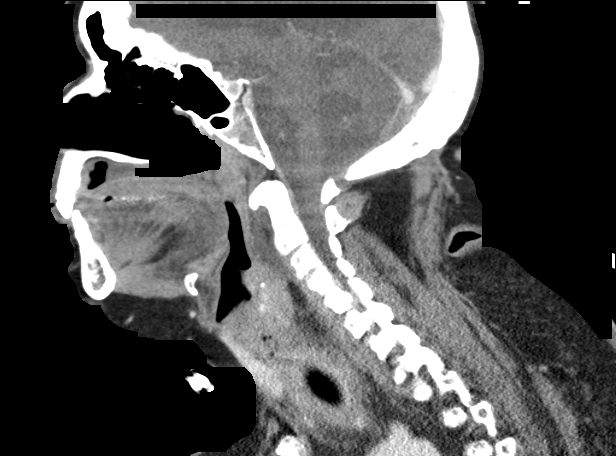
[im 48/95  bone]
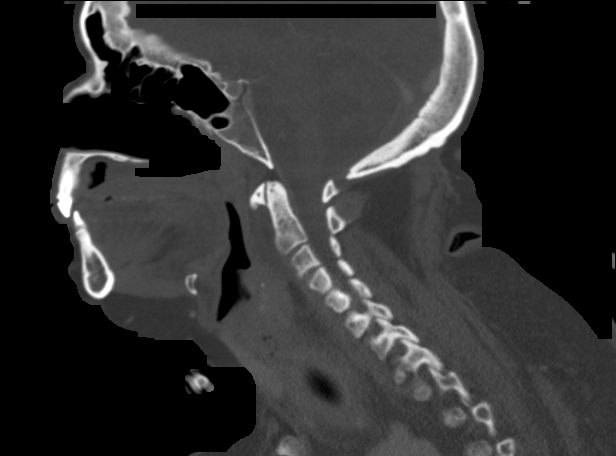
[im 55/95  bone]
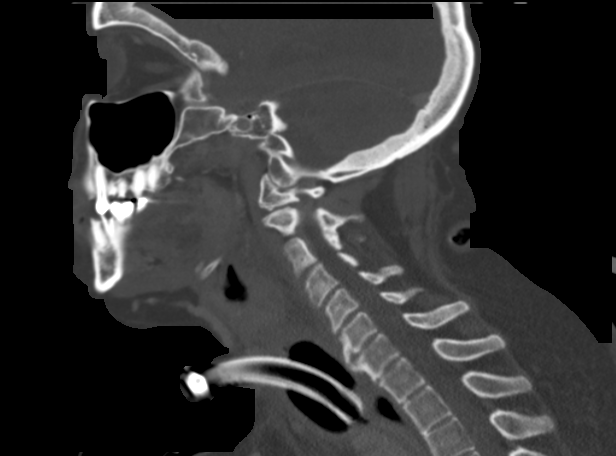
[im 63/95  bone]
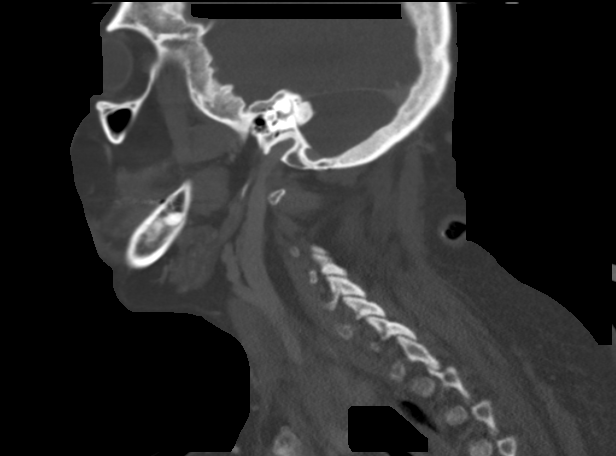

[Series 405: orthogonal, idose (2) · axial · 0.55mm/px · z∈[+662,+782]mm · 4 of 101 slices shown, 5 images]
[im 21/101  soft-tissue]
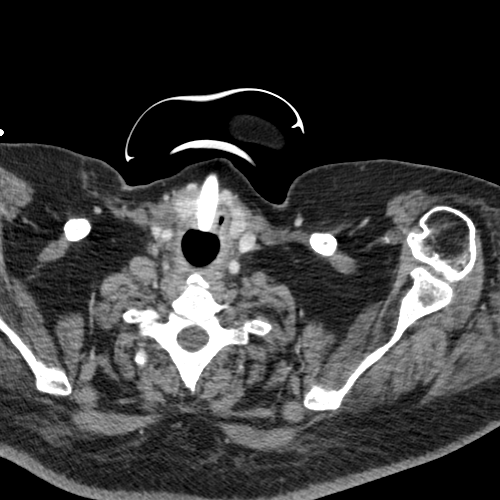
[im 21/101  bone]
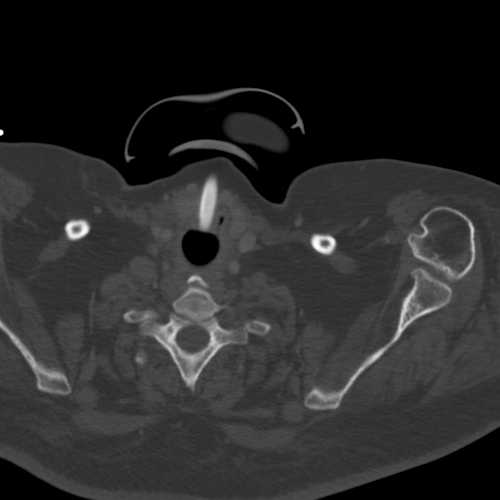
[im 41/101  bone]
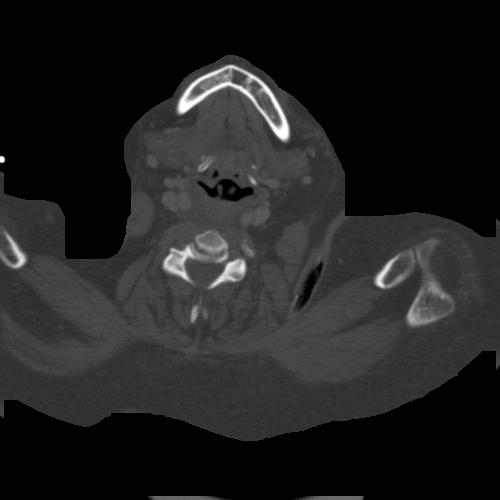
[im 61/101  bone]
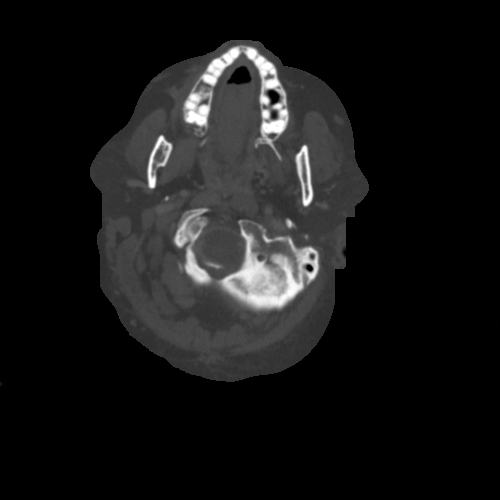
[im 81/101  bone]
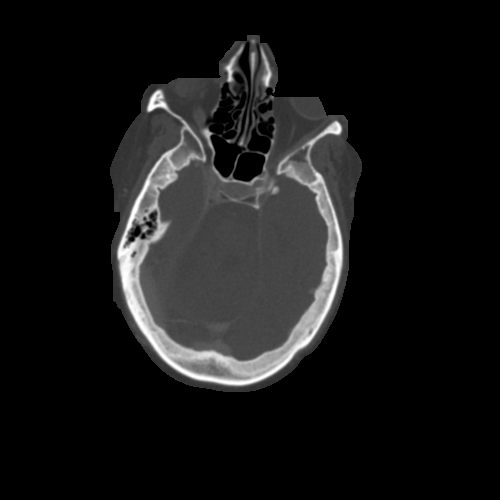

[16 of 33 positions shown; findings below may reference images not displayed]

FINDINGS: Pharynx and larynx: Tracheostomy tube above the carina. Prominent
adenoidal soft tissues partially efface the pharynx. Small
retropharyngeal effusion extending ventrally into RIGHT neck without
rim enhancing fluid collection.

Salivary glands: Normal.

Thyroid: Bisected by tracheostomy tube, otherwise unremarkable.

Lymph nodes: No lymphadenopathy by CT size criteria.

Vascular: Normal. Included arterial and venous structures are
patent. Superior vena cava not included on this examination.

Limited intracranial: Severe global brain atrophy and included
brain.

Visualized orbits: Normal.

Mastoids and visualized paranasal sinuses: Well-aerated.

Skeleton: Poor dentition.  No destructive bony lesions.

Upper chest: Please see dedicated chest CT from same day, reported
separately.
IMPRESSION: Small retropharyngeal effusion extending to RIGHT neck without
discrete abscess.

Patent neck vessels though SVC is not included on this examination.

Included view of the head demonstrates severe brain atrophy.

## 2016-12-21 IMAGING — CT CT CHEST W/ CM
1 series · 1 of 1 positions shown · IV contrast (iopamidol)
Comparison: CT neck 11/20/2015

CLINICAL DATA: Rule out SVC syndrome.

EXAM:
CT CHEST WITH CONTRAST
TECHNIQUE: Multidetector CT imaging of the chest was performed during
intravenous contrast administration.
CONTRAST:  100mL [id number] IOPAMIDOL ([id number]) INJECTION 61%

[Series 100: scout · sagittal · 0.6mm · 0.68mm/px · 1 of 1 slices shown]
[im 1/1]
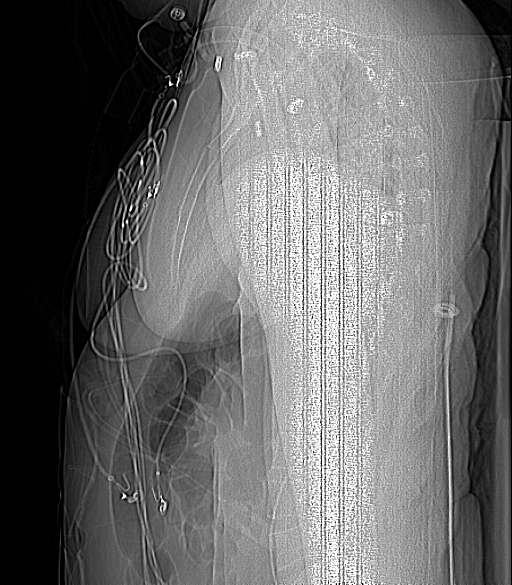

[1 of 1 positions shown; findings below may reference images not displayed]

FINDINGS: Right arm contrast injection. Right subclavian vein patent.
Right-sided SVC is patent. There are numerous enhancing collateral
veins in the mediastinum anterior to the SVC which fill with
contrast extending into the right anterior chest wall and
subcutaneous tissues. This suggests high venous pressures.

Duplicated SVC. Left-sided SVC is not significantly opacified.
Review of the CT from the same day reveals patency of the left
jugular vein. The proximal left of the SVC is patent. The left
subclavian vein is not opacified and could be occluded. This would
require repeat study with left arm injection. Venous collaterals are
present in the anterior chest wall on the left.

Heart size is normal. No pericardial effusion. No central pulmonary
emboli. Pulmonary arteries normal in caliber. Aortic arch normal in
caliber.

Tracheostomy in good position. Posterior basilar airspace disease
bilaterally may be atelectasis or pneumonia. Relatively low lung
volumes. Patchy upper lobe airspace disease bilaterally may
represent pneumonia or atelectasis.

No significant pleural effusion.

Fatty infiltration of the liver. No mass in the upper abdomen. No
acute skeletal abnormality.
IMPRESSION: Duplicated SVC. Right arm injection for the CT. Right subclavian
vein and right-sided SVC are patent. There are numerous enhancing
subcutaneous venous collaterals in the anterior chest wall right
greater than left suggesting high venous pressures from venous
stenosis. The left subclavian vein and left SVC are not opacified
due to right arm injection. The collaterals would suggest there is a
venous stenosis which could be on the left. Repeat CT chest with
left arm injection is suggested to further evaluate the left
subclavian vein in left SVC for patency.

Low lung volumes. Extensive bilateral airspace disease in the upper
lower lobes. Some of this may be atelectasis and some may be due to
pneumonia.

## 2019-10-10 DEATH — deceased
